# Patient Record
Sex: Male | Born: 1995
Health system: Southern US, Community
[De-identification: ages and names within clinical notes are randomized; demographics above are authoritative.]

---
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
## 2014-04-03 ENCOUNTER — Emergency Department: Payer: Self-pay | Admitting: Emergency Medicine

## 2014-04-04 LAB — URINALYSIS, COMPLETE
Bacteria: NONE SEEN
Bilirubin,UR: NEGATIVE
Blood: NEGATIVE
Glucose,UR: NEGATIVE mg/dL (ref 0–75)
Leukocyte Esterase: NEGATIVE
Nitrite: NEGATIVE
Ph: 6 (ref 4.5–8.0)
Protein: NEGATIVE
RBC,UR: <1 /HPF (ref 0–5)
Specific Gravity: 1.009 (ref 1.003–1.030)
Squamous Epithelial: NONE SEEN
WBC UR: <1 /HPF (ref 0–5)

## 2014-04-04 LAB — CBC WITH DIFFERENTIAL/PLATELET
Basophil #: 0.2 10*3/uL — ABNORMAL HIGH (ref 0.0–0.1)
Basophil %: 2.2 %
Eosinophil #: 0.1 10*3/uL (ref 0.0–0.7)
Eosinophil %: 0.9 %
HCT: 47.3 % (ref 40.0–52.0)
HGB: 16.3 g/dL (ref 13.0–18.0)
Lymphocyte #: 1.3 10*3/uL (ref 1.0–3.6)
Lymphocyte %: 17.3 %
MCH: 29.4 pg (ref 26.0–34.0)
MCHC: 34.5 g/dL (ref 32.0–36.0)
MCV: 85 fL (ref 80–100)
Monocyte #: 1.1 x10 3/mm — ABNORMAL HIGH (ref 0.2–1.0)
Monocyte %: 15.1 %
Neutrophil #: 4.7 10*3/uL (ref 1.4–6.5)
Neutrophil %: 64.5 %
Platelet: 274 10*3/uL (ref 150–440)
RBC: 5.54 10*6/uL (ref 4.40–5.90)
RDW: 12.9 % (ref 11.5–14.5)
WBC: 7.3 10*3/uL (ref 3.8–10.6)

## 2014-04-04 LAB — COMPREHENSIVE METABOLIC PANEL
Albumin: 4.1 g/dL (ref 3.8–5.6)
Alkaline Phosphatase: 64 U/L
Anion Gap: 12 (ref 7–16)
BUN: 8 mg/dL — ABNORMAL LOW (ref 9–21)
Bilirubin,Total: 0.4 mg/dL (ref 0.2–1.0)
Calcium, Total: 9.1 mg/dL (ref 9.0–10.7)
Chloride: 104 mmol/L (ref 97–107)
Co2: 27 mmol/L — ABNORMAL HIGH (ref 16–25)
Creatinine: 0.87 mg/dL (ref 0.60–1.30)
EGFR (African American): >60
EGFR (Non-African Amer.): >60
Glucose: 93 mg/dL (ref 65–99)
Osmolality: 283 (ref 275–301)
Potassium: 3.5 mmol/L (ref 3.3–4.7)
SGOT(AST): 16 U/L (ref 10–41)
SGPT (ALT): 26 U/L
Sodium: 143 mmol/L — ABNORMAL HIGH (ref 132–141)
Total Protein: 7.7 g/dL (ref 6.4–8.6)

## 2014-04-04 LAB — LIPASE, BLOOD: Lipase: 77 U/L (ref 73–393)

## 2017-09-04 DIAGNOSIS — L739 Follicular disorder, unspecified: Secondary | ICD-10-CM | POA: Diagnosis not present

## 2017-10-14 DIAGNOSIS — B356 Tinea cruris: Secondary | ICD-10-CM | POA: Diagnosis not present

## 2018-06-23 DIAGNOSIS — Z0000 Encounter for general adult medical examination without abnormal findings: Secondary | ICD-10-CM | POA: Diagnosis not present

## 2018-06-23 DIAGNOSIS — Z130 Encounter for screening for diseases of the blood and blood-forming organs and certain disorders involving the immune mechanism: Secondary | ICD-10-CM | POA: Diagnosis not present

## 2018-06-23 DIAGNOSIS — Z131 Encounter for screening for diabetes mellitus: Secondary | ICD-10-CM | POA: Diagnosis not present

## 2018-06-23 DIAGNOSIS — R030 Elevated blood-pressure reading, without diagnosis of hypertension: Secondary | ICD-10-CM | POA: Diagnosis not present

## 2018-07-07 DIAGNOSIS — I10 Essential (primary) hypertension: Secondary | ICD-10-CM | POA: Diagnosis not present

## 2018-07-07 DIAGNOSIS — R011 Cardiac murmur, unspecified: Secondary | ICD-10-CM | POA: Diagnosis not present

## 2018-07-07 DIAGNOSIS — I370 Nonrheumatic pulmonary valve stenosis: Secondary | ICD-10-CM | POA: Diagnosis not present

## 2018-08-03 DIAGNOSIS — I370 Nonrheumatic pulmonary valve stenosis: Secondary | ICD-10-CM | POA: Diagnosis not present

## 2019-11-11 ENCOUNTER — Ambulatory Visit: Payer: Self-pay | Attending: Internal Medicine

## 2019-11-11 DIAGNOSIS — Z23 Encounter for immunization: Secondary | ICD-10-CM

## 2019-11-11 NOTE — Progress Notes (Signed)
   [facility] Vaccination [facility]  Name:  John Bradford    MRN: [id number] DOB: 1996-05-07  11/11/2019  [name] was observed post Covid-19 immunization for 15 minutes without incident. He was provided with Vaccine Information Sheet and instruction to access the V-Safe system.   [name] was instructed to call 911 with any severe reactions post vaccine: Marland Kitchen Difficulty breathing  . Swelling of face and throat  . A fast heartbeat  . A bad rash all over body  . Dizziness and weakness   Immunizations Administered    Name Date Dose VIS Date Route   Pfizer COVID-19 Vaccine 11/11/2019  4:41 PM 0.3 mL 07/27/2019 Intramuscular   Manufacturer: [employer], [employer]   Lot: [id number]   NDC: [address]-7

## 2019-12-02 ENCOUNTER — Ambulatory Visit: Payer: Self-pay | Attending: Internal Medicine

## 2019-12-02 DIAGNOSIS — Z23 Encounter for immunization: Secondary | ICD-10-CM

## 2019-12-02 NOTE — Progress Notes (Signed)
   Covid-[facility] Vaccination Clinic  Name:  John Bradford    MRN: [id number] DOB: 12-29-1995  12/02/2019  [name] was observed post Covid-19 immunization for 15 minutes without incident. He was provided with Vaccine Information Sheet and instruction to access the V-Safe system.   [name] was instructed to call 911 with any severe reactions post vaccine: Marland Kitchen Difficulty breathing  . Swelling of face and throat  . A fast heartbeat  . A bad rash all over body  . Dizziness and weakness   Immunizations Administered    Name Date Dose VIS Date Route   Pfizer COVID-19 Vaccine 12/02/2019  3:43 PM 0.3 mL 07/27/2019 Intramuscular   Manufacturer: [employer], [employer]   Lot: [id number]   NDC: [address]-4

## 2024-08-21 DIAGNOSIS — I370 Nonrheumatic pulmonary valve stenosis: Secondary | ICD-10-CM

## 2024-09-26 ENCOUNTER — Ambulatory Visit
